# Patient Record
Sex: Male | Born: 1970 | Hispanic: Yes | Marital: Married | Smoking: Never smoker
Health system: Southern US, Community
[De-identification: ages and names within clinical notes are randomized; demographics above are authoritative.]

## PROBLEM LIST (undated history)

## (undated) DIAGNOSIS — S52509A Unspecified fracture of the lower end of unspecified radius, initial encounter for closed fracture: Secondary | ICD-10-CM

## (undated) DIAGNOSIS — Z789 Other specified health status: Secondary | ICD-10-CM

## (undated) HISTORY — PX: NO PAST SURGERIES: SHX2092

---

## 2018-09-26 LAB — GLUCOSE, POCT (MANUAL RESULT ENTRY): POC GLUCOSE: 113 mg/dL — AB (ref 70–99)

## 2019-02-28 ENCOUNTER — Emergency Department (HOSPITAL_COMMUNITY)
Admission: EM | Admit: 2019-02-28 | Discharge: 2019-03-01 | Disposition: A | Discharge status: Home / Self Care | Payer: No Typology Code available for payment source | Attending: Emergency Medicine | Admitting: Emergency Medicine

## 2019-02-28 ENCOUNTER — Emergency Department (HOSPITAL_COMMUNITY): Payer: No Typology Code available for payment source

## 2019-02-28 ENCOUNTER — Encounter (HOSPITAL_COMMUNITY): Payer: Self-pay | Admitting: Emergency Medicine

## 2019-02-28 DIAGNOSIS — Y939 Activity, unspecified: Secondary | ICD-10-CM | POA: Insufficient documentation

## 2019-02-28 DIAGNOSIS — Y999 Unspecified external cause status: Secondary | ICD-10-CM | POA: Diagnosis not present

## 2019-02-28 DIAGNOSIS — S52501A Unspecified fracture of the lower end of right radius, initial encounter for closed fracture: Secondary | ICD-10-CM

## 2019-02-28 DIAGNOSIS — S6991XA Unspecified injury of right wrist, hand and finger(s), initial encounter: Secondary | ICD-10-CM | POA: Diagnosis present

## 2019-02-28 DIAGNOSIS — S52611A Displaced fracture of right ulna styloid process, initial encounter for closed fracture: Secondary | ICD-10-CM | POA: Insufficient documentation

## 2019-02-28 DIAGNOSIS — Y929 Unspecified place or not applicable: Secondary | ICD-10-CM | POA: Insufficient documentation

## 2019-02-28 DIAGNOSIS — W12XXXA Fall on and from scaffolding, initial encounter: Secondary | ICD-10-CM | POA: Insufficient documentation

## 2019-02-28 NOTE — ED Triage Notes (Signed)
Pt reports he fell 5 feet off a scaffold this morning.  Only complain of right arm pain.

## 2019-02-28 NOTE — ED Notes (Signed)
Patient transported to X-ray 

## 2019-02-28 NOTE — ED Provider Notes (Signed)
[facility] EMERGENCY DEPARTMENT Provider Note   CSN: [id number] Arrival date & time: 02/28/19  2057    History   Chief Complaint Chief Complaint  Patient presents with  . Fall    HPI Norman Fisher is a 48 y.o. male.     The history is provided by the patient and medical records.     48 year old male presenting to the ED with right wrist injury.  He was on a scaffolding this morning trying to fix something high up on the wall when he had a fall.  He fell to the ground and tried to break his fall with his right wrist.  States immediate onset of pain and deformity noted.  He tried to apply brace to the wrist which has caused worsening hand swelling.  He denies numbness/tingling.  He is right hand dominant.  He denies any other injuries or head trauma.  History reviewed. No pertinent past medical history.  There are no active problems to display for this patient.   History reviewed. No pertinent surgical history.      Home Medications    Prior to Admission medications   Not on File    Family History No family history on file.  Social History Social History   Tobacco Use  . Smoking status: Not on file  Substance Use Topics  . Alcohol use: Not on file  . Drug use: Not on file     Allergies   Patient has no known allergies.   Review of Systems Review of Systems  Musculoskeletal: Positive for arthralgias.  All other systems reviewed and are negative.    Physical Exam Updated Vital Signs BP 124/77 (BP Location: Left Arm)   Pulse 71   Temp 99.5 F (37.5 C) (Oral)   Resp 18   SpO2 94%   Physical Exam Vitals signs and nursing note reviewed.  Constitutional:      Appearance: He is well-developed.  HENT:     Head: Normocephalic and atraumatic.  Eyes:     Conjunctiva/sclera: Conjunctivae normal.     Pupils: Pupils are equal, round, and reactive to light.  Neck:     Musculoskeletal: Normal range of motion.   Cardiovascular:     Rate and Rhythm: Normal rate and regular rhythm.     Heart sounds: Normal heart sounds.  Pulmonary:     Effort: Pulmonary effort is normal.     Breath sounds: Normal breath sounds.  Abdominal:     General: Bowel sounds are normal.     Palpations: Abdomen is soft.  Musculoskeletal: Normal range of motion.     Comments: Right wrist in homemade sling, has compressible brace around the wrist which was removed immediately upon my exam Right wrist has obvious deformity, swelling extending into the hand; no skin tenting or open wounds; compartments are soft and compressible; radial pulse intact, able to move fingers subtly but with some pain; normal cap refill and distal sensation  Skin:    General: Skin is warm and dry.  Neurological:     Mental Status: He is alert and oriented to person, place, and time.      ED Treatments / Results  Labs (all labs ordered are listed, but only abnormal results are displayed) Labs Reviewed - No data to display  EKG None  Radiology Dg Forearm Right  Result Date: 02/28/2019 CLINICAL DATA:  Pt reports he fell 5 feet off a scaffold this morning. Only complain of right arm pain. Limited  views pt cannot move arm wrist is swollen EXAM: RIGHT FOREARM - 2 VIEW COMPARISON:  None. FINDINGS: Comminuted displaced fracture of the distal radius, across the metaphysis. No other fracture. No wrist dislocation. Elbow joint is normally spaced and aligned. There is distal forearm and diffuse wrist soft tissue swelling. IMPRESSION: Displaced comminuted fracture of the distal radial metaphysis. No other fractures. No dislocation. Electronically Signed   By: Amie Portland M.D.   On: 02/28/2019 21:54   Dg Wrist Complete Right  Result Date: 03/01/2019 CLINICAL DATA:  Fall 5 ft.  Wrist pain and swelling EXAM: RIGHT WRIST - COMPLETE 3+ VIEW COMPARISON:  None. FINDINGS: Comminuted, displaced and angulated fracture noted through the distal right radius. Ulnar  styloid fracture noted. No subluxation or dislocation. IMPRESSION: Comminuted, displaced and angulated distal right radial fracture. Ulnar styloid fracture. Electronically Signed   By: Charlett Nose M.D.   On: 03/01/2019 00:08    Procedures Procedures (including critical care time)  Medications Ordered in ED Medications - No data to display   Initial Impression / Assessment and Plan / ED Course  I have reviewed the triage vital signs and the nursing notes.  Pertinent labs & imaging results that were available during my care of the patient were reviewed by me and considered in my medical decision making (see chart for details).  48 year old male presenting to the ED after a fall that occurred this morning.  He was doing work on a house and fell off a scaffolding, attempted to break his fall with right arm.  Immediate onset of pain, swelling, deformity of the right wrist.  He denies any other injuries.  No head injury or loss of consciousness.  He wrapped a Velcro brace around his wrist and has been wearing that in a homemade sling today.  Upon initial exam, brace was removed immediately.  He does have noted deformity of the wrist but no skin tenting.  There is diffuse swelling extending down into the hand.  Compartments are soft and compressible, normal radial pulse and cap refill.  Distal sensation intact.  Screening films obtained from triage of the forearm with evidence of distal radius fracture.  Dedicated wrist films revealing comminuted, displaced, and angulated right radial fracture along with ulnar styloid fracture.  After discussion with attending physician, Dr. Erma Heritage, who reviewed films-- gentle traction was used for slight reduction and sugar tong splint was applied.  He was also given arm sling.  He will need close follow-up with hand surgery for likely operative repair.  Rx Percocet and Motrin for pain.  He can return here for any new or acute changes.   Final Clinical Impressions(s) /  ED Diagnoses   Final diagnoses:  Closed fracture of distal end of right radius, unspecified fracture morphology, initial encounter  Closed displaced fracture of styloid process of right ulna, initial encounter    ED Discharge Orders         Ordered    oxyCODONE-acetaminophen (PERCOCET) 5-325 MG tablet  Every 4 hours PRN     03/01/19 0111    ibuprofen (ADVIL,MOTRIN) 800 MG tablet  3 times daily     03/01/19 0111           Garlon Hatchet, PA-C 03/01/19 0115    Shaune Pollack, MD 03/02/19 1900

## 2019-03-01 MED ORDER — IBUPROFEN 800 MG PO TABS: 800.0000 mg | ORAL_TABLET | Freq: Three times a day (TID) | ORAL | 0 refills | Status: AC

## 2019-03-01 MED ORDER — OXYCODONE-ACETAMINOPHEN 5-325 MG PO TABS
1.0000 | ORAL_TABLET | Freq: Once | ORAL | Status: AC
  Administered 2019-03-01: 1 via ORAL
  Filled 2019-03-01: qty 1

## 2019-03-01 MED ORDER — OXYCODONE-ACETAMINOPHEN 5-325 MG PO TABS: 1.0000 | ORAL_TABLET | ORAL | 0 refills | Status: AC | PRN

## 2019-03-01 NOTE — Discharge Instructions (Signed)
Take the prescribed medication as directed for pain.  Leave splint in place. Follow-up with Dr. Merlyn Lot-- call his office in the morning for appt. Return to the ED for new or worsening symptoms.

## 2019-03-04 ENCOUNTER — Other Ambulatory Visit: Payer: Self-pay

## 2019-03-04 ENCOUNTER — Encounter (HOSPITAL_BASED_OUTPATIENT_CLINIC_OR_DEPARTMENT_OTHER): Payer: Self-pay | Admitting: *Deleted

## 2019-03-04 ENCOUNTER — Other Ambulatory Visit: Payer: Self-pay | Admitting: Orthopedic Surgery

## 2019-03-04 DIAGNOSIS — S52509A Unspecified fracture of the lower end of unspecified radius, initial encounter for closed fracture: Secondary | ICD-10-CM

## 2019-03-04 HISTORY — DX: Unspecified fracture of the lower end of unspecified radius, initial encounter for closed fracture: S52.509A

## 2019-03-07 ENCOUNTER — Ambulatory Visit (HOSPITAL_BASED_OUTPATIENT_CLINIC_OR_DEPARTMENT_OTHER): Payer: No Typology Code available for payment source | Admitting: Anesthesiology

## 2019-03-07 ENCOUNTER — Encounter (HOSPITAL_BASED_OUTPATIENT_CLINIC_OR_DEPARTMENT_OTHER)
Admission: RE | Disposition: A | Discharge status: Home / Self Care | Payer: Self-pay | Source: Home / Self Care | Attending: Orthopedic Surgery

## 2019-03-07 ENCOUNTER — Encounter (HOSPITAL_BASED_OUTPATIENT_CLINIC_OR_DEPARTMENT_OTHER): Payer: Self-pay

## 2019-03-07 ENCOUNTER — Ambulatory Visit (HOSPITAL_BASED_OUTPATIENT_CLINIC_OR_DEPARTMENT_OTHER)
Admission: RE | Admit: 2019-03-07 | Discharge: 2019-03-07 | Disposition: A | Discharge status: Home / Self Care | Payer: No Typology Code available for payment source | Attending: Orthopedic Surgery | Admitting: Orthopedic Surgery

## 2019-03-07 DIAGNOSIS — S52571A Other intraarticular fracture of lower end of right radius, initial encounter for closed fracture: Secondary | ICD-10-CM | POA: Insufficient documentation

## 2019-03-07 DIAGNOSIS — W12XXXA Fall on and from scaffolding, initial encounter: Secondary | ICD-10-CM | POA: Diagnosis not present

## 2019-03-07 DIAGNOSIS — Y990 Civilian activity done for income or pay: Secondary | ICD-10-CM | POA: Insufficient documentation

## 2019-03-07 HISTORY — DX: Other specified health status: Z78.9

## 2019-03-07 HISTORY — PX: OPEN REDUCTION INTERNAL FIXATION (ORIF) DISTAL RADIAL FRACTURE: SHX5989

## 2019-03-07 HISTORY — DX: Unspecified fracture of the lower end of unspecified radius, initial encounter for closed fracture: S52.509A

## 2019-03-07 SURGERY — OPEN REDUCTION INTERNAL FIXATION (ORIF) DISTAL RADIUS FRACTURE
Anesthesia: Regional | Site: Wrist | Laterality: Right

## 2019-03-07 MED ORDER — DEXAMETHASONE SODIUM PHOSPHATE 10 MG/ML IJ SOLN
INTRAMUSCULAR | Status: DC | PRN
  Administered 2019-03-07: 10 mg via INTRAVENOUS

## 2019-03-07 MED ORDER — PROPOFOL 10 MG/ML IV BOLUS
INTRAVENOUS | Status: DC | PRN
  Administered 2019-03-07: 230 mg via INTRAVENOUS

## 2019-03-07 MED ORDER — ONDANSETRON HCL 4 MG/2ML IJ SOLN
INTRAMUSCULAR | Status: AC
  Filled 2019-03-07: qty 2

## 2019-03-07 MED ORDER — SCOPOLAMINE 1 MG/3DAYS TD PT72: 1.0000 | MEDICATED_PATCH | Freq: Once | TRANSDERMAL | Status: DC | PRN

## 2019-03-07 MED ORDER — MIDAZOLAM HCL 2 MG/2ML IJ SOLN
1.0000 mg | INTRAMUSCULAR | Status: DC | PRN
  Administered 2019-03-07: 1.5 mg via INTRAVENOUS

## 2019-03-07 MED ORDER — CEFAZOLIN SODIUM-DEXTROSE 2-4 GM/100ML-% IV SOLN
INTRAVENOUS | Status: AC
  Filled 2019-03-07: qty 100

## 2019-03-07 MED ORDER — PROPOFOL 10 MG/ML IV BOLUS
INTRAVENOUS | Status: AC
  Filled 2019-03-07: qty 20

## 2019-03-07 MED ORDER — MIDAZOLAM HCL 2 MG/2ML IJ SOLN
INTRAMUSCULAR | Status: AC
  Filled 2019-03-07: qty 2

## 2019-03-07 MED ORDER — LIDOCAINE HCL (CARDIAC) PF 100 MG/5ML IV SOSY
PREFILLED_SYRINGE | INTRAVENOUS | Status: DC | PRN
  Administered 2019-03-07: 100 mg via INTRAVENOUS

## 2019-03-07 MED ORDER — FENTANYL CITRATE (PF) 100 MCG/2ML IJ SOLN
INTRAMUSCULAR | Status: AC
  Filled 2019-03-07: qty 2

## 2019-03-07 MED ORDER — FENTANYL CITRATE (PF) 100 MCG/2ML IJ SOLN: 25.0000 ug | INTRAMUSCULAR | Status: DC | PRN

## 2019-03-07 MED ORDER — OXYCODONE-ACETAMINOPHEN 5-325 MG PO TABS: ORAL_TABLET | ORAL | 0 refills | Status: AC

## 2019-03-07 MED ORDER — LIDOCAINE 2% (20 MG/ML) 5 ML SYRINGE
INTRAMUSCULAR | Status: AC
  Filled 2019-03-07: qty 5

## 2019-03-07 MED ORDER — HYDROMORPHONE HCL 1 MG/ML IJ SOLN: 0.2500 mg | INTRAMUSCULAR | Status: DC | PRN

## 2019-03-07 MED ORDER — LACTATED RINGERS IV SOLN
INTRAVENOUS | Status: DC
  Administered 2019-03-07: 11:00:00 via INTRAVENOUS

## 2019-03-07 MED ORDER — CHLORHEXIDINE GLUCONATE 4 % EX LIQD: 60.0000 mL | Freq: Once | CUTANEOUS | Status: DC

## 2019-03-07 MED ORDER — FENTANYL CITRATE (PF) 100 MCG/2ML IJ SOLN
50.0000 ug | INTRAMUSCULAR | Status: DC | PRN
  Administered 2019-03-07: 25 ug via INTRAVENOUS
  Administered 2019-03-07: 75 ug via INTRAVENOUS

## 2019-03-07 MED ORDER — CEFAZOLIN SODIUM-DEXTROSE 2-4 GM/100ML-% IV SOLN
2.0000 g | INTRAVENOUS | Status: AC
  Administered 2019-03-07: 2 g via INTRAVENOUS

## 2019-03-07 MED ORDER — DEXAMETHASONE SODIUM PHOSPHATE 10 MG/ML IJ SOLN
INTRAMUSCULAR | Status: AC
  Filled 2019-03-07: qty 1

## 2019-03-07 SURGICAL SUPPLY — 61 items
BANDAGE ACE 3X5.8 VEL STRL LF (GAUZE/BANDAGES/DRESSINGS) ×3 IMPLANT
BIT DRILL 2.0 LNG QUCK RELEASE (BIT) ×1 IMPLANT
BIT DRILL 2.8X5 QR DISP (BIT) ×3 IMPLANT
BLADE SURG 15 STRL LF DISP TIS (BLADE) ×2 IMPLANT
BLADE SURG 15 STRL SS (BLADE) ×4
BNDG ESMARK 4X9 LF (GAUZE/BANDAGES/DRESSINGS) ×3 IMPLANT
BNDG GAUZE ELAST 4 BULKY (GAUZE/BANDAGES/DRESSINGS) ×3 IMPLANT
BNDG PLASTER X FAST 3X3 WHT LF (CAST SUPPLIES) ×30 IMPLANT
CHLORAPREP W/TINT 26 (MISCELLANEOUS) ×3 IMPLANT
CORD BIPOLAR FORCEPS 12FT (ELECTRODE) ×3 IMPLANT
COVER BACK TABLE REUSABLE LG (DRAPES) ×3 IMPLANT
COVER MAYO STAND REUSABLE (DRAPES) ×3 IMPLANT
COVER WAND RF STERILE (DRAPES) IMPLANT
CUFF TOURN SGL QUICK 18X4 (TOURNIQUET CUFF) ×3 IMPLANT
CUFF TOURN SGL QUICK 24 (TOURNIQUET CUFF)
CUFF TRNQT CYL 24X4X16.5-23 (TOURNIQUET CUFF) IMPLANT
DRAPE EXTREMITY T 121X128X90 (DISPOSABLE) ×3 IMPLANT
DRAPE OEC MINIVIEW 54X84 (DRAPES) ×3 IMPLANT
DRAPE SURG 17X23 STRL (DRAPES) ×3 IMPLANT
DRILL 2.0 LNG QUICK RELEASE (BIT) ×3
GAUZE SPONGE 4X4 12PLY STRL (GAUZE/BANDAGES/DRESSINGS) ×3 IMPLANT
GAUZE XEROFORM 1X8 LF (GAUZE/BANDAGES/DRESSINGS) ×3 IMPLANT
GLOVE BIO SURGEON STRL SZ7.5 (GLOVE) ×3 IMPLANT
GLOVE BIOGEL PI IND STRL 7.0 (GLOVE) ×2 IMPLANT
GLOVE BIOGEL PI IND STRL 8 (GLOVE) ×1 IMPLANT
GLOVE BIOGEL PI IND STRL 8.5 (GLOVE) ×1 IMPLANT
GLOVE BIOGEL PI INDICATOR 7.0 (GLOVE) ×4
GLOVE BIOGEL PI INDICATOR 8 (GLOVE) ×2
GLOVE BIOGEL PI INDICATOR 8.5 (GLOVE) ×2
GLOVE ECLIPSE 6.5 STRL STRAW (GLOVE) ×3 IMPLANT
GLOVE SURG ORTHO 8.0 STRL STRW (GLOVE) ×3 IMPLANT
GOWN STRL REUS W/ TWL LRG LVL3 (GOWN DISPOSABLE) ×2 IMPLANT
GOWN STRL REUS W/TWL LRG LVL3 (GOWN DISPOSABLE) ×4
GOWN STRL REUS W/TWL XL LVL3 (GOWN DISPOSABLE) ×6 IMPLANT
GUIDEWIRE ORTHO 0.054X6 (WIRE) ×9 IMPLANT
NEEDLE HYPO 25X1 1.5 SAFETY (NEEDLE) ×3 IMPLANT
NS IRRIG 1000ML POUR BTL (IV SOLUTION) ×3 IMPLANT
PACK BASIN DAY SURGERY FS (CUSTOM PROCEDURE TRAY) ×3 IMPLANT
PAD CAST 3X4 CTTN HI CHSV (CAST SUPPLIES) ×1 IMPLANT
PADDING CAST COTTON 3X4 STRL (CAST SUPPLIES) ×2
PLATE PROXIMAL VDU ACULOC (Plate) ×3 IMPLANT
SCREW CORT FT 18X2.3XLCK HEX (Screw) ×1 IMPLANT
SCREW CORT FT 20X2.3XLCK HEX (Screw) ×1 IMPLANT
SCREW CORTICAL LOCKING 2.3X18M (Screw) ×4 IMPLANT
SCREW CORTICAL LOCKING 2.3X20M (Screw) ×6 IMPLANT
SCREW CORTICAL LOCKING 2.3X22M (Screw) ×2 IMPLANT
SCREW FX18X2.3XSMTH LCK NS CRT (Screw) ×1 IMPLANT
SCREW FX20X2.3XSMTH LCK NS CRT (Screw) ×2 IMPLANT
SCREW FX22X2.3XLCK SMTH NS CRT (Screw) ×1 IMPLANT
SCREW HEXALOBE NON-LOCK 3.5X14 (Screw) ×6 IMPLANT
SCREW NLCKG 13 3.5X13 HEXA (Screw) ×1 IMPLANT
SCREW NON-LOCK 3.5X13 (Screw) ×3 IMPLANT
SLEEVE SCD COMPRESS KNEE MED (MISCELLANEOUS) IMPLANT
SLING ARM FOAM STRAP LRG (SOFTGOODS) ×3 IMPLANT
STOCKINETTE 4X48 STRL (DRAPES) ×3 IMPLANT
SUT ETHILON 4 0 PS 2 18 (SUTURE) ×3 IMPLANT
SUT VICRYL 4-0 PS2 18IN ABS (SUTURE) ×3 IMPLANT
SYR BULB 3OZ (MISCELLANEOUS) ×3 IMPLANT
SYR CONTROL 10ML LL (SYRINGE) ×3 IMPLANT
TOWEL GREEN STERILE FF (TOWEL DISPOSABLE) ×6 IMPLANT
UNDERPAD 30X30 (UNDERPADS AND DIAPERS) ×3 IMPLANT

## 2019-03-07 NOTE — Op Note (Signed)
03/07/2019 [facility]  Operative Note  Pre Op Diagnosis: Right comminuted intraarticular distal radius fracture  Post Op Diagnosis: Right comminuted intraarticular distal radius fracture  Procedure:  1. ORIF Right comminuted intraarticular distal radius fracture, 3 intraarticular fragments 2. Right brachioradialis release  Surgeon: Betha Loa, MD  Assistant: Cindee Salt, MD  Anesthesia: General with regional  Fluids: Per anesthesia flow sheet  EBL: minimal  Complications: None  Specimen: None  Tourniquet Time:  Total Tourniquet Time Documented: Upper Arm (Right) - 45 minutes Total: Upper Arm (Right) - 45 minutes   Disposition: Stable to PACU  INDICATIONS:  Norman Fisher is a 48 y.o. male states he fell from scaffolding injuring right wrist.  Seen in ED where XR showed right distal radius fracture.  Splinted and followed up in office.  We discussed nonoperative and operative treatment options.  He wished to proceed with operative fixation.  Risks, benefits, and alternatives of surgery were discussed including the risk of blood loss; infection; damage to nerves, vessels, tendons, ligaments, bone; failure of surgery; need for additional surgery; complications with wound healing; continued pain; nonunion; malunion; stiffness.  We also discussed the possible need for bone graft and the benefits and risks including the possibility of disease transmission.  He voiced understanding of these risks and elected to proceed.    OPERATIVE COURSE:  After being identified preoperatively by myself, the patient and I agreed upon the procedure and site of procedure.  Surgical site was marked.  The risks, benefits and alternatives of the surgery were reviewed and he wished to proceed.  Surgical consent had been signed.  He was given IV Ancef as preoperative antibiotic prophylaxis.  He was transferred to the operating room and placed on the operating room table in supine  position with the Right upper extremity on an armboard. General anesthesia was induced by the anesthesiologist.  A regional block had been performed by anesthesia in preoperative holding.  The Right upper extremity was prepped and draped in normal sterile orthopedic fashion.  A surgical pause was performed between the surgeons, anesthesia and operating room staff, and all were in agreement as to the patient, procedure and site of procedure.  Tourniquet at the proximal aspect of the extremity was inflated to 250 mmHg after exsanguination of the limb with an Esmarch bandage.  Standard volar Sherilyn Cooter approach was used.  The bipolar electrocautery was used to obtain hemostasis.  The superficial and deep portions of the FCR tendon sheath were incised, and the FCR and FPL were swept ulnarly to protect the palmar cutaneous branch of the median nerve.  The brachioradialis was released at the radial side of the radius.  The pronator quadratus was released and elevated with the periosteal elevator.  The fracture site was identified and cleared of soft tissue interposition and hematoma.  It was reduced under direct visualization.  There was intraarticular extension creating three intraarticular fragments.  An AcuMed volar distal radial locking plate was selected.  It was secured to the bone with the guidepins.  C-arm was used in AP and lateral projections to ensure appropriate reduction and position of the hardware and adjustments made as necessary.  Standard AO drilling and measuring technique was used.  A single screw was placed in the slotted hole in the shaft of the plate.  The distal holes were filled with locking pegs with the exception of the styloid holes, which were filled with locking screws.  The remaining holes in the shaft of the plate  were filled with nonlocking screws.  Good purchase was obtained.  C-arm was used in AP, lateral and oblique projections to ensure appropriate reduction and position of hardware, which  was the case.  There was no intra-articular penetration of hardware.  The wound was copiously irrigated with sterile saline.  Pronator quadratus was repaired back over top of the plate using 4-0 Vicryl suture.  Vicryl suture was placed in the subcutaneous tissues in an inverted interrupted fashion and the skin was closed with 4-0 nylon in a horizontal mattress fashion.  There was good pronation and supination of the wrist without crepitance.  The wound was then dressed with sterile Xeroform, 4x4s, and wrapped with a Kerlix bandage.  A volar splint was placed and wrapped with Kerlix and Ace bandage.  Tourniquet was deflated at 45 minutes.  Fingertips were pink with brisk capillary refill after deflation of the tourniquet.  Operative drapes were broken down.  The patient was awoken from anesthesia safely.  He was transferred back to the stretcher and taken to the PACU in stable condition.  I will see him back in the office in one week for postoperative followup.  I will give him a prescription for Percocet 5/325 1-2 tabs PO q6 hours prn pain, dispense # 30.    Betha Loa, MD Electronically signed, 03/07/19

## 2019-03-07 NOTE — Anesthesia Procedure Notes (Addendum)
Anesthesia Regional Block: Supraclavicular block   Pre-Anesthetic Checklist: ,, timeout performed, Correct Patient, Correct Site, Correct Laterality, Correct Procedure, Correct Position, site marked, Risks and benefits discussed,  Surgical consent,  Pre-op evaluation,  At surgeon's request and post-op pain management  Laterality: Right     Needles:  Injection technique: Single-shot  Needle Type: Stimulator Needle - 40          Additional Needles:   Procedures: Doppler guided,,,, ultrasound used (permanent image in chart),,,,   Nerve Stimulator or Paresthesia:  Response: 0.5 mA,   Additional Responses:   Narrative:  Start time: 03/07/2019 10:55 AM End time: 03/07/2019 11:15 AM Injection made incrementally with aspirations every 5 mL.  Performed by: Personally  Anesthesiologist: Dorris Singh, MD

## 2019-03-07 NOTE — Anesthesia Procedure Notes (Signed)
Procedure Name: LMA Insertion Date/Time: 03/07/2019 12:11 PM Performed by: Burna Cash, CRNA Pre-anesthesia Checklist: Patient identified, Emergency Drugs available, Suction available and Patient being monitored Patient Re-evaluated:Patient Re-evaluated prior to induction Oxygen Delivery Method: Circle system utilized Preoxygenation: Pre-oxygenation with 100% oxygen Induction Type: IV induction Ventilation: Mask ventilation without difficulty LMA: LMA inserted LMA Size: 4.0 Number of attempts: 1 Airway Equipment and Method: Bite block Placement Confirmation: positive ETCO2 Tube secured with: Tape Dental Injury: Teeth and Oropharynx as per pre-operative assessment

## 2019-03-07 NOTE — Transfer of Care (Signed)
Immediate Anesthesia Transfer of Care Note  Patient: Norman Fisher  Procedure(s) Performed: OPEN REDUCTION INTERNAL FIXATION (ORIF) RIGHT DISTAL RADIAL FRACTURE (Right Wrist)  Patient Location: PACU  Anesthesia Type:GA combined with regional for post-op pain  Level of Consciousness: sedated  Airway & Oxygen Therapy: Patient Spontanous Breathing and Patient connected to face mask oxygen  Post-op Assessment: Report given to RN and Post -op Vital signs reviewed and stable  Post vital signs: Reviewed and stable  Last Vitals:  Vitals Value Taken Time  BP 98/66 03/07/2019  1:22 PM  Temp    Pulse 63 03/07/2019  1:25 PM  Resp 13 03/07/2019  1:25 PM  SpO2 99 % 03/07/2019  1:25 PM  Vitals shown include unvalidated device data.  Last Pain:  Vitals:   03/07/19 1323  TempSrc:   PainSc: (P) 0-No pain      Patients Stated Pain Goal: 4 (03/07/19 1053)  Complications: No apparent anesthesia complications

## 2019-03-07 NOTE — Anesthesia Postprocedure Evaluation (Signed)
Anesthesia Post Note  Patient: Norman Fisher  Procedure(s) Performed: OPEN REDUCTION INTERNAL FIXATION (ORIF) RIGHT DISTAL RADIAL FRACTURE (Right Wrist)     Patient location during evaluation: PACU Anesthesia Type: Regional Level of consciousness: awake Pain management: pain level controlled Vital Signs Assessment: post-procedure vital signs reviewed and stable Respiratory status: spontaneous breathing Cardiovascular status: stable Postop Assessment: no apparent nausea or vomiting Anesthetic complications: no    Last Vitals:  Vitals:   03/07/19 1400 03/07/19 1426  BP: 118/76 127/79  Pulse: 69 66  Resp: 13 18  Temp:  36.4 C  SpO2: 95% 99%    Last Pain:  Vitals:   03/07/19 1426  TempSrc:   PainSc: 0-No pain                 Tremell Reimers

## 2019-03-07 NOTE — Op Note (Signed)
I assisted Surgeon(s) and Role:    * Betha Loa, MD - Primary    Cindee Salt, MD on the Procedure(s): OPEN REDUCTION INTERNAL FIXATION (ORIF) RIGHT DISTAL RADIAL FRACTURE on 03/07/2019.  I provided assistance on this case as follows: setup, approach, debridement, reduction, stabilization, and fixation of the fracture, closure f the wound and application of the dressing and splint.  Electronically signed by: Cindee Salt, MD Date: 03/07/2019 Time: 1:19 PM

## 2019-03-07 NOTE — Discharge Instructions (Addendum)
[facility] Instructions Hand Surgery  Wound Care: Keep your hand elevated above the level of your heart.  Do not allow it to dangle by your side.  Keep the dressing dry and do not remove it unless your doctor advises you to do so.  He will usually change it at the time of your post-op visit.  Moving your fingers is advised to stimulate circulation but will depend on the site of your surgery.  If you have a splint applied, your doctor will advise you regarding movement.  Activity: Do not drive or operate machinery today.  Rest today and then you may return to your normal activity and work as indicated by your physician.  Diet:  Drink liquids today or eat a light diet.  You may resume a regular diet tomorrow.    General expectations: Pain for two to three days. Fingers may become slightly swollen.  Call your doctor if any of the following occur: Severe pain not relieved by pain medication. Elevated temperature. Dressing soaked with blood. Inability to move fingers. White or bluish color to fingers.    Post Anesthesia Home Care Instructions  Activity: Get plenty of rest for the remainder of the day. A responsible individual must stay with you for 24 hours following the procedure.  For the next 24 hours, DO NOT: -Drive a car -Advertising copywriter -Drink alcoholic beverages -Take any medication unless instructed by your physician -Make any legal decisions or sign important papers.  Meals: Start with liquid foods such as gelatin or soup. Progress to regular foods as tolerated. Avoid greasy, spicy, heavy foods. If nausea and/or vomiting occur, drink only clear liquids until the nausea and/or vomiting subsides. Call your physician if vomiting continues.  Special Instructions/Symptoms: Your throat may feel dry or sore from the anesthesia or the breathing tube placed in your throat during surgery. If this causes discomfort, gargle with warm salt water. The discomfort should disappear  within 24 hours.  Regional Anesthesia Blocks  1. Numbness or the inability to move the "blocked" extremity may last from 3-48 hours after placement. The length of time depends on the medication injected and your individual response to the medication. If the numbness is not going away after 48 hours, call your surgeon.  2. The extremity that is blocked will need to be protected until the numbness is gone and the  Strength has returned. Because you cannot feel it, you will need to take extra care to avoid injury. Because it may be weak, you may have difficulty moving it or using it. You may not know what position it is in without looking at it while the block is in effect.  3. For blocks in the legs and feet, returning to weight bearing and walking needs to be done carefully. You will need to wait until the numbness is entirely gone and the strength has returned. You should be able to move your leg and foot normally before you try and bear weight or walk. You will need someone to be with you when you first try to ensure you do not fall and possibly risk injury.  4. Bruising and tenderness at the needle site are common side effects and will resolve in a few days.  5. Persistent numbness or new problems with movement should be communicated to the surgeon.

## 2019-03-07 NOTE — H&P (Signed)
  Norman Fisher is an 48 y.o. male.   Chief Complaint: right wrist fracture HPI: 48 yo male states he fell from scaffolding 02/21/19 at work injuring right wrist.  Seen in ED where XR revealed distal radius fracture.  Splinted and followed up in office.  He wishes to proceed with operative fixation.  Allergies: No Known Allergies  Past Medical History:  Diagnosis Date  . Distal radius fracture 03/04/2019   right  . Medical history non-contributory     Past Surgical History:  Procedure Laterality Date  . NO PAST SURGERIES      Family History: History reviewed. No pertinent family history.  Social History:   reports that he has never smoked. He has never used smokeless tobacco. He reports previous alcohol use. He reports that he does not use drugs.  Medications: No medications prior to admission.    No results found for this or any previous visit (from the past 48 hour(s)).  No results found.   A comprehensive review of systems was negative.  Height 5\' 5"  (1.651 m), weight 70 kg.  General appearance: alert, cooperative and appears stated age Head: Normocephalic, without obvious abnormality, atraumatic Neck: supple, symmetrical, trachea midline Cardio: regular rate and rhythm Resp: clear to auscultation bilaterally Extremities: Intact sensation and capillary refill all digits.  +epl/fpl/io.  No wounds.  Pulses: 2+ and symmetric Skin: Skin color, texture, turgor normal. No rashes or lesions Neurologic: Grossly normal Incision/Wound: none  Assessment/Plan Right distal radius fracture.  Non operative and operative treatment options have been discussed with the patient and patient wishes to proceed with operative treatment. Risks, benefits, and alternatives of surgery have been discussed and the patient agrees with the plan of care.   Betha Loa 03/07/2019, 9:22 AM

## 2019-03-07 NOTE — Anesthesia Preprocedure Evaluation (Signed)
Anesthesia Evaluation  Patient identified by MRN, date of birth, ID band  Reviewed: Allergy & Precautions, NPO status , Patient's Chart, lab work & pertinent test results  Airway Mallampati: II  TM Distance: >3 FB     Dental   Pulmonary    breath sounds clear to auscultation       Cardiovascular negative cardio ROS   Rhythm:Regular Rate:Normal     Neuro/Psych    GI/Hepatic negative GI ROS, Neg liver ROS,   Endo/Other  negative endocrine ROS  Renal/GU negative Renal ROS     Musculoskeletal   Abdominal   Peds  Hematology   Anesthesia Other Findings   Reproductive/Obstetrics                             Anesthesia Physical Anesthesia Plan  ASA: II  Anesthesia Plan: General   Post-op Pain Management:    Induction: Intravenous  PONV Risk Score and Plan: 2 and Ondansetron, Dexamethasone and Midazolam  Airway Management Planned:   Additional Equipment:   Intra-op Plan:   Post-operative Plan: Extubation in OR  Informed Consent: I have reviewed the patients History and Physical, chart, labs and discussed the procedure including the risks, benefits and alternatives for the proposed anesthesia with the patient or authorized representative who has indicated his/her understanding and acceptance.     Dental advisory given  Plan Discussed with: CRNA and Anesthesiologist  Anesthesia Plan Comments:         Anesthesia Quick Evaluation

## 2019-03-07 NOTE — Progress Notes (Signed)
Assisted Dr. Edwards with right, ultrasound guided, supraclavicular block. Side rails up, monitors on throughout procedure. See vital signs in flow sheet. Tolerated Procedure well. 

## 2019-03-11 ENCOUNTER — Encounter (HOSPITAL_BASED_OUTPATIENT_CLINIC_OR_DEPARTMENT_OTHER): Payer: Self-pay | Admitting: Orthopedic Surgery

## 2019-03-12 NOTE — Op Note (Signed)
Intra-operative fluoroscopic images in the AP, lateral, and oblique views were taken and evaluated by myself.  Reduction and hardware placement were confirmed.  There was no intraarticular penetration of permanent hardware.  

## 2020-03-08 IMAGING — DX RIGHT FOREARM - 2 VIEW
3 series · 3 of 3 positions shown · non-contrast
Comparison: None.

CLINICAL DATA: Pt reports he fell 5 feet off a scaffold this
morning. Only complain of right arm pain. Limited views pt cannot
move arm wrist is swollen

EXAM:
RIGHT FOREARM - 2 VIEW

[forearm ap]
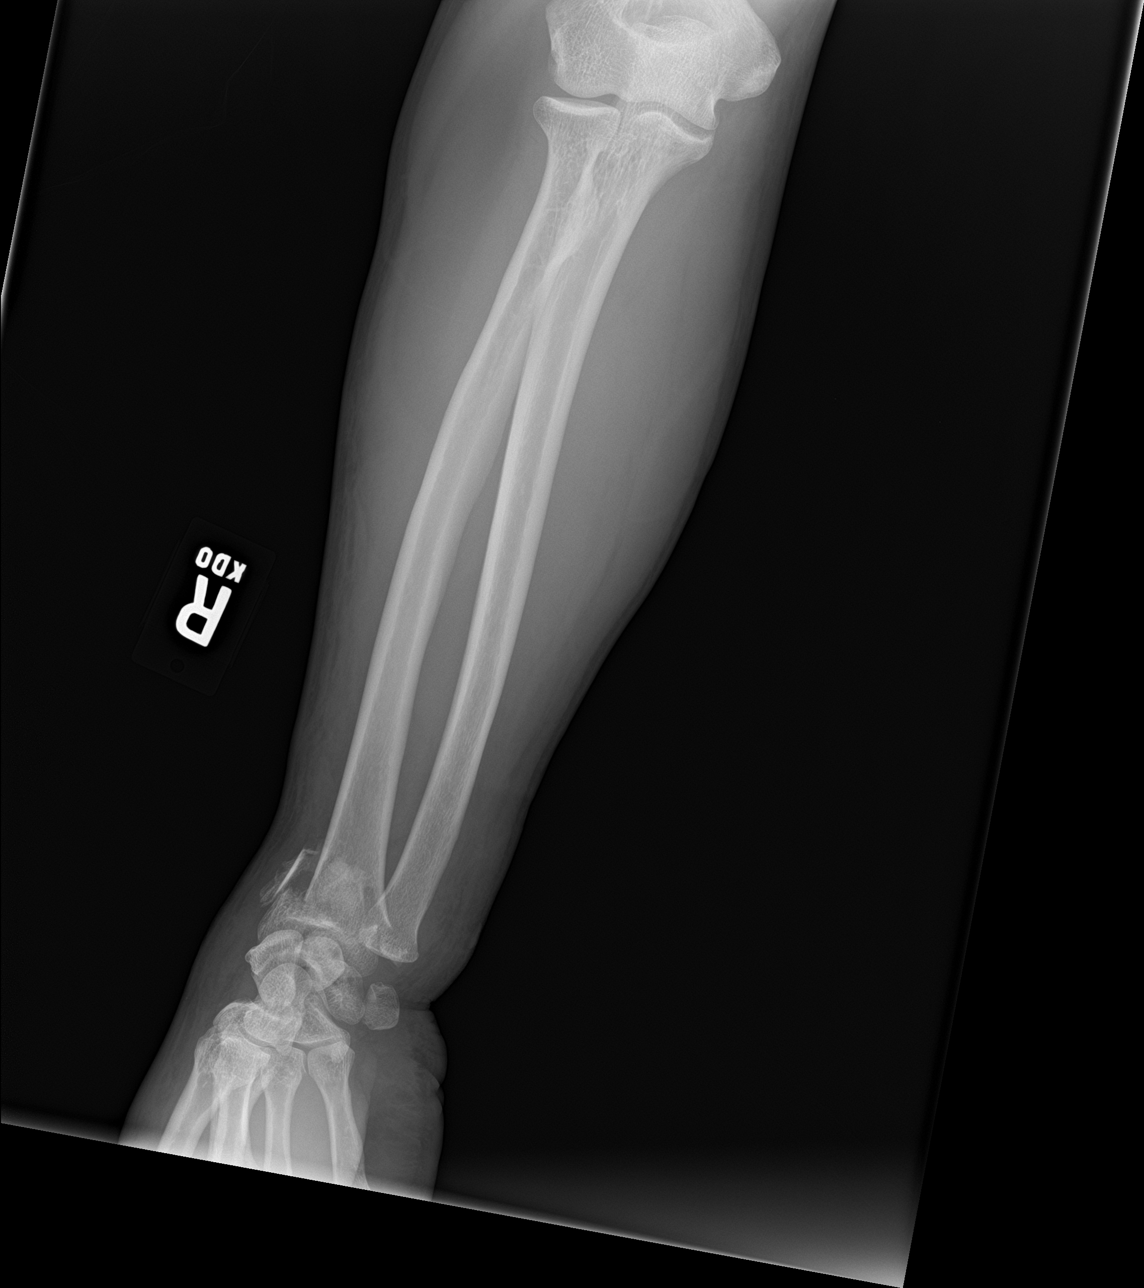

[forearm lat (1 of 2)]
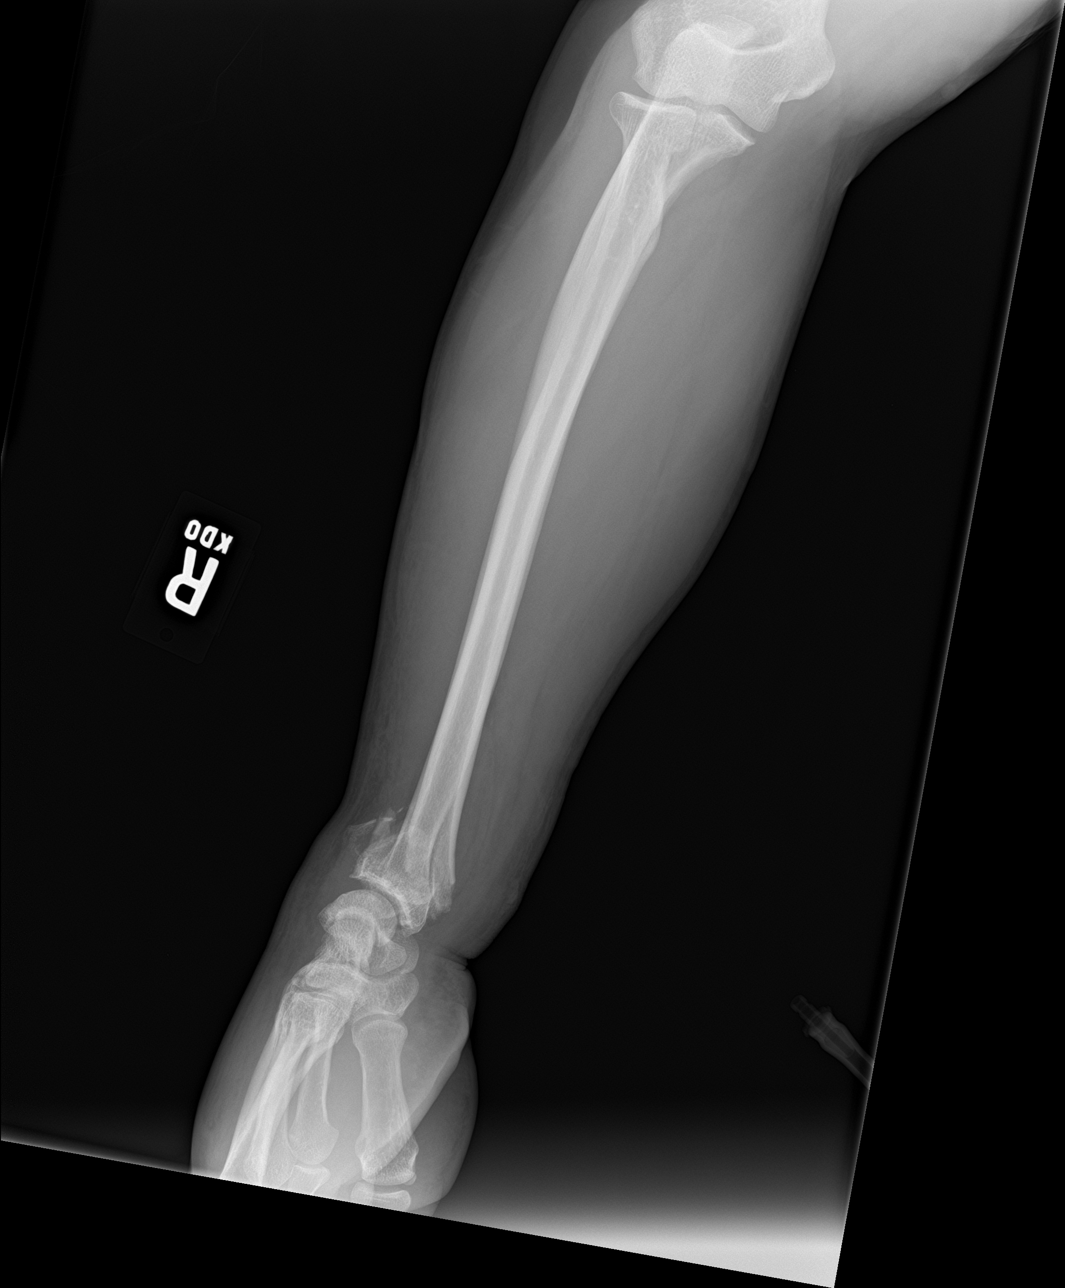

[forearm lat (2 of 2)]
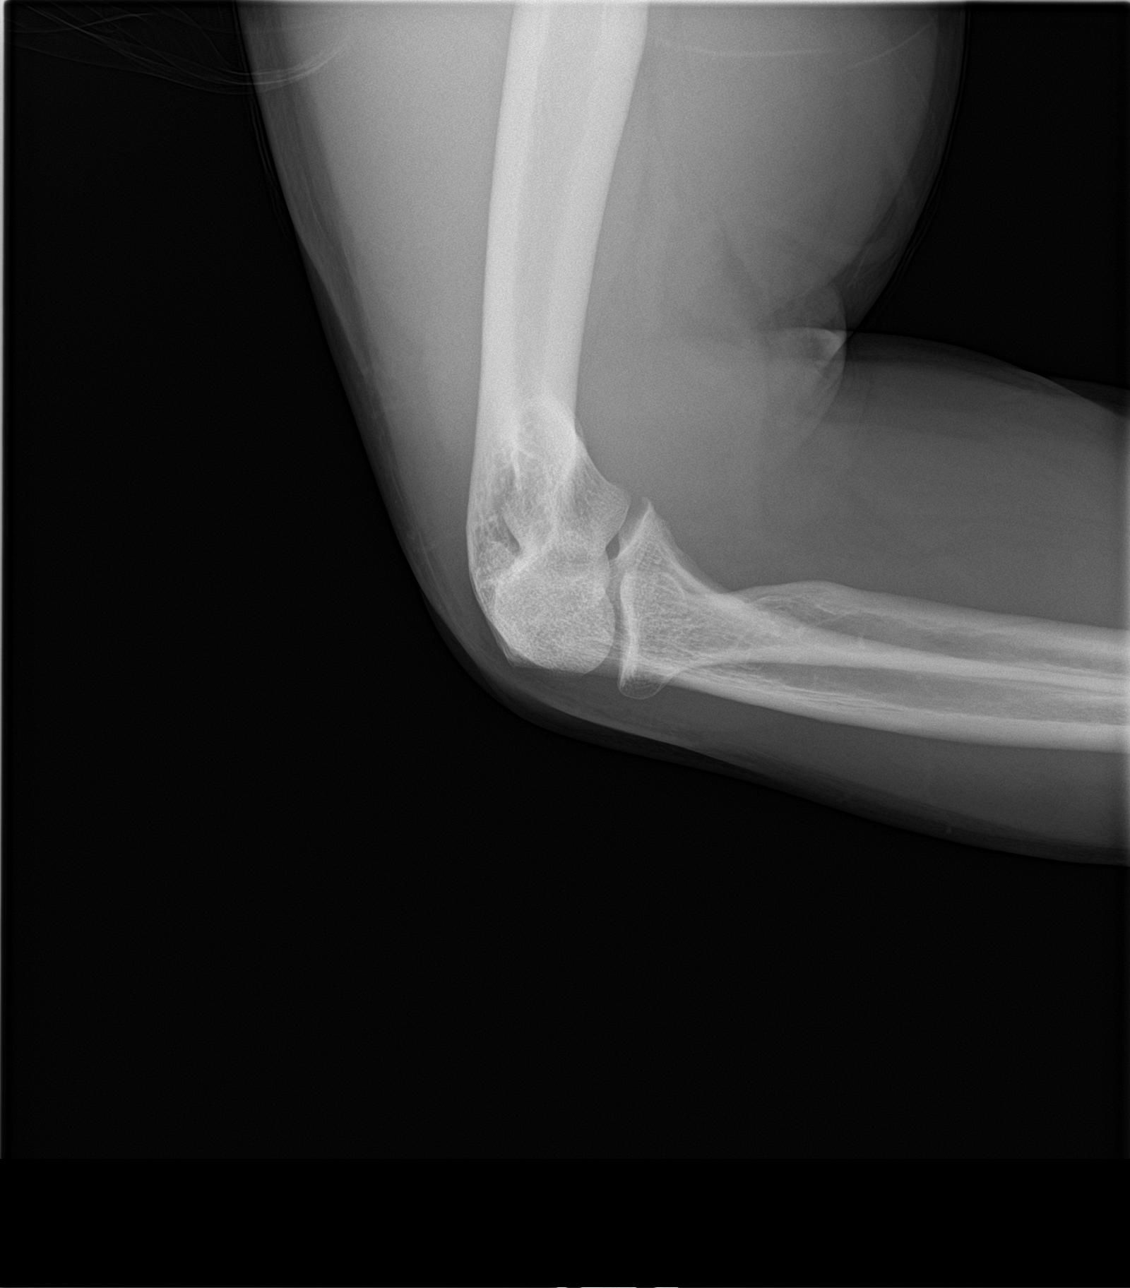

[3 of 3 positions shown; findings below may reference images not displayed]

FINDINGS: Comminuted displaced fracture of the distal radius, across the
metaphysis. No other fracture. No wrist dislocation. Elbow joint is
normally spaced and aligned.

There is distal forearm and diffuse wrist soft tissue swelling.
IMPRESSION: Displaced comminuted fracture of the distal radial metaphysis. No
other fractures. No dislocation.

## 2020-03-08 IMAGING — CR RIGHT WRIST - COMPLETE 3+ VIEW
4 series · 4 of 4 positions shown · non-contrast
Comparison: None.

CLINICAL DATA: Fall 5 ft.  Wrist pain and swelling

EXAM:
RIGHT WRIST - COMPLETE 3+ VIEW

[wrist pa]
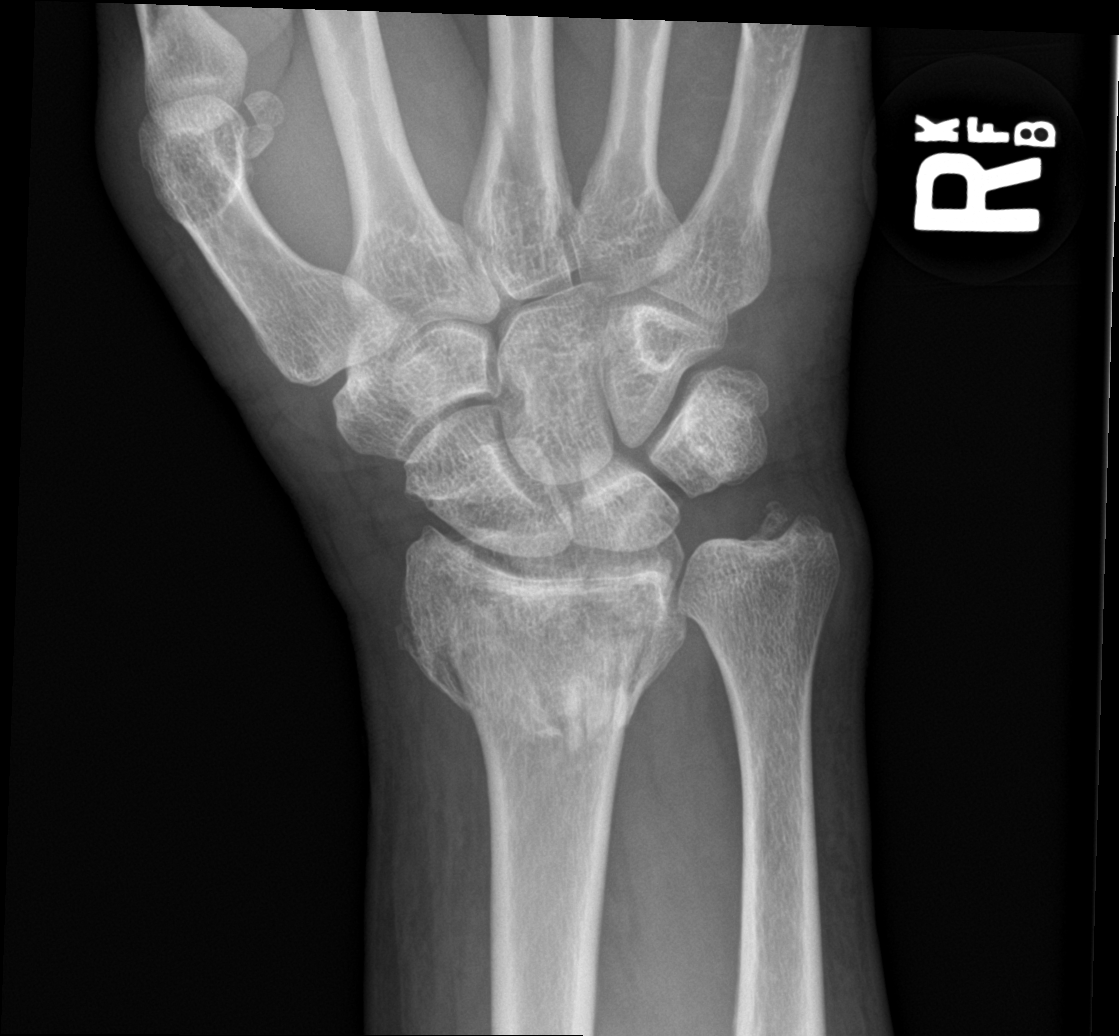

[wrist obl]
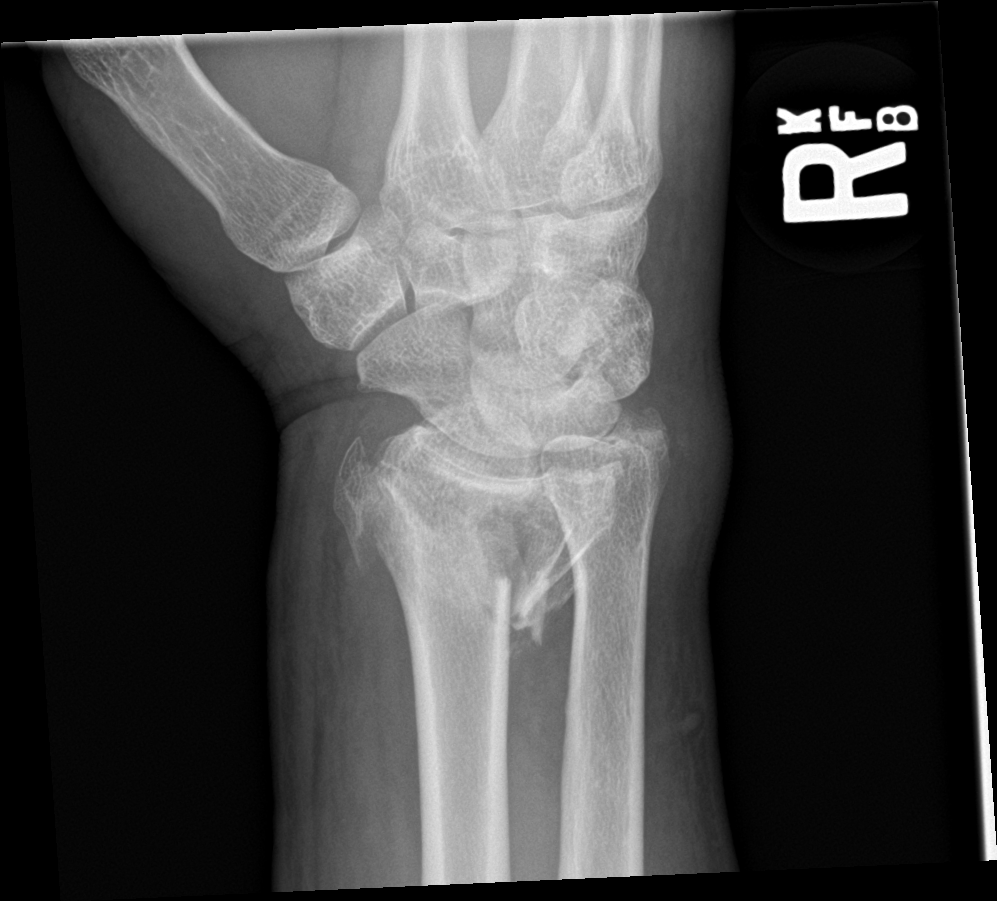

[wrist lat]
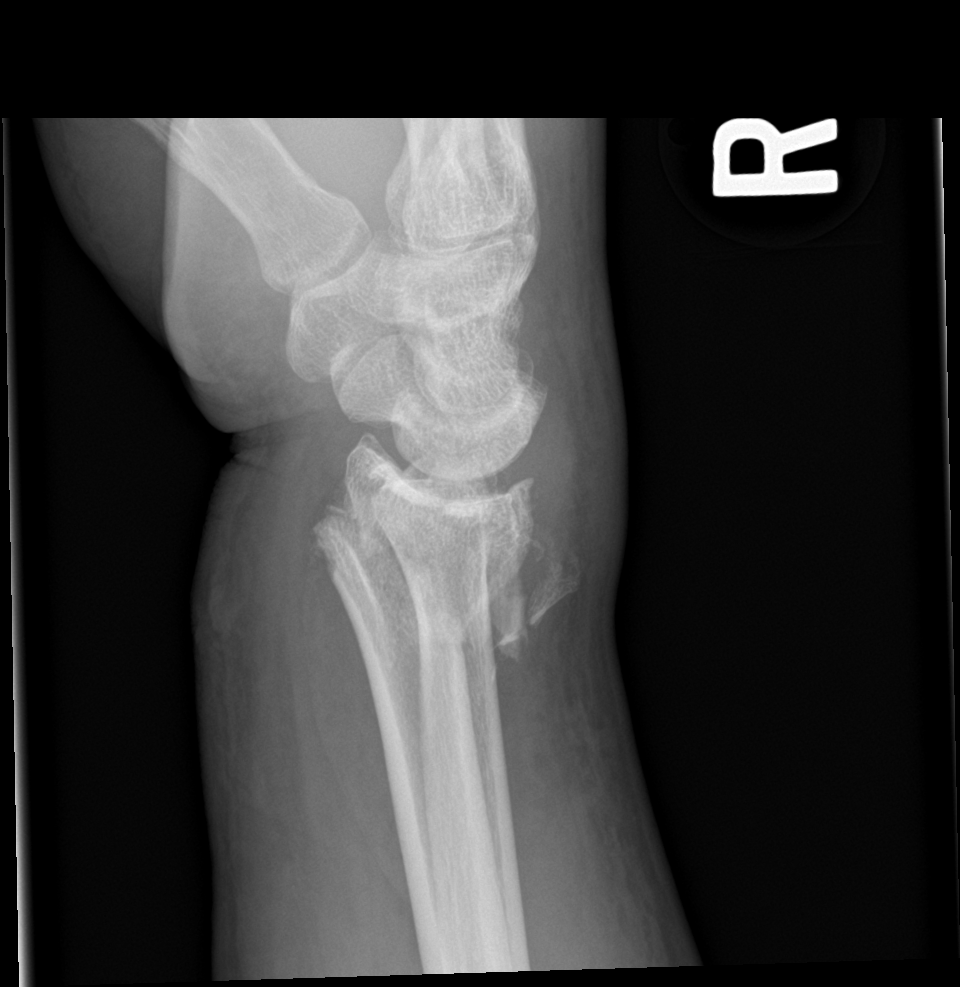

[wrist navicular]
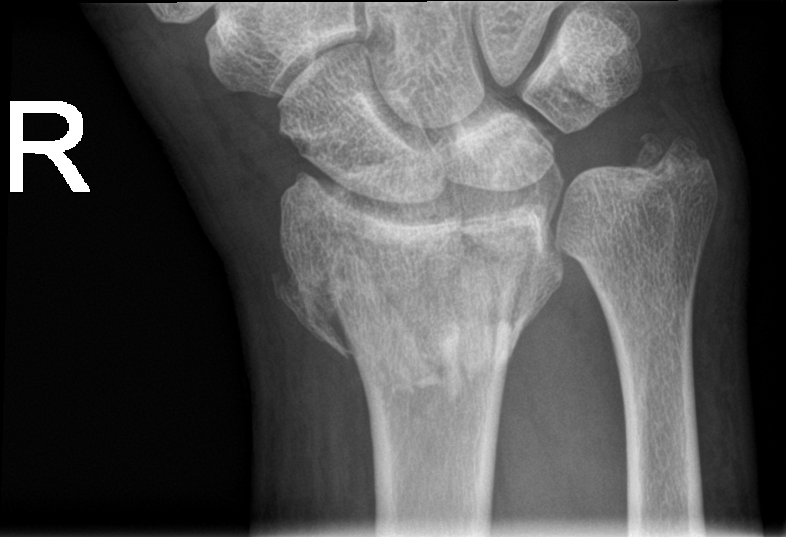

[4 of 4 positions shown; findings below may reference images not displayed]

FINDINGS: Comminuted, displaced and angulated fracture noted through the
distal right radius. Ulnar styloid fracture noted. No subluxation or
dislocation.
IMPRESSION: Comminuted, displaced and angulated distal right radial fracture.
Ulnar styloid fracture.

## 2022-11-15 NOTE — Congregational Nurse Program (Signed)
  Dept: [phone]   Congregational Nurse Program Note  Date of Encounter: 11/15/2022  Past Medical History: Past Medical History:  Diagnosis Date   Distal radius fracture 03/04/2019   right   Medical history non-contributory     Encounter Details:  CNP Questionnaire - 11/15/22 1418       Questionnaire   Ask client: Do you give verbal consent for me to treat you today? Yes    Student Assistance N/A    [place] Patient Present[place]    Visit Setting with Client Organization    Patient Status Migrant    Insurance Uninsured ([employer]/Self-Pay/Medicaid Family Planning)    Insurance/Financial Assistance Referral N/A    Medication N/A    Medical Provider No    Screening Referrals Made Annual Wellness Visit    Medical Referrals Made [facility]    Medical Appointment Made [facility]    Recently w/o PCP, now 1st time PCP visit completed due to CNs referral or appointment made N/A    Food N/A    Transportation N/A    Housing/Utilities N/A    Interpersonal Safety N/A    Interventions [facility] [employer] [facility];Case Management    Abnormal to Normal Screening Since Last CN Visit N/A    Screenings CN Performed N/A    Sent Client to Lab for: N/A    Did client attend any of the following based off CNs referral or appointments made? N/A    ED Visit Averted N/A    Life-Saving Intervention Made N/A             Patient was assisted in making an appt. At [facility], 11/23/22 3:15 pm. Clinic details given. Co-pay $25.

## 2022-11-23 ENCOUNTER — Ambulatory Visit: Payer: Self-pay | Admitting: Student
